# Patient Record
Sex: Male | Born: 1965 | Race: Black or African American | Hispanic: No | Marital: Single | Smoking: Never smoker
Health system: Southern US, Community
[De-identification: ages and names within clinical notes are randomized; demographics above are authoritative.]

## PROBLEM LIST (undated history)

## (undated) ENCOUNTER — Emergency Department: Payer: Medicare HMO

## (undated) DIAGNOSIS — I10 Essential (primary) hypertension: Secondary | ICD-10-CM

## (undated) DIAGNOSIS — J45909 Unspecified asthma, uncomplicated: Secondary | ICD-10-CM

## (undated) DIAGNOSIS — T4145XA Adverse effect of unspecified anesthetic, initial encounter: Secondary | ICD-10-CM

## (undated) DIAGNOSIS — E785 Hyperlipidemia, unspecified: Secondary | ICD-10-CM

---

## 2007-09-02 HISTORY — PX: CARDIAC CATHETERIZATION: SHX172

## 2007-09-02 HISTORY — PX: CORONARY ANGIOPLASTY: SHX604

## 2016-03-03 ENCOUNTER — Ambulatory Visit
Admission: RE | Admit: 2016-03-03 | Discharge: 2016-03-03 | Disposition: A | Discharge status: Home / Self Care | Payer: Disability Insurance | Source: Ambulatory Visit | Attending: Internal Medicine | Admitting: Internal Medicine

## 2016-03-03 ENCOUNTER — Other Ambulatory Visit: Payer: Self-pay | Admitting: Internal Medicine

## 2016-03-03 DIAGNOSIS — S79912A Unspecified injury of left hip, initial encounter: Secondary | ICD-10-CM | POA: Insufficient documentation

## 2016-03-03 DIAGNOSIS — M25551 Pain in right hip: Secondary | ICD-10-CM

## 2016-03-03 DIAGNOSIS — M25552 Pain in left hip: Secondary | ICD-10-CM | POA: Diagnosis present

## 2016-03-03 DIAGNOSIS — S72001A Fracture of unspecified part of neck of right femur, initial encounter for closed fracture: Secondary | ICD-10-CM | POA: Insufficient documentation

## 2016-03-03 DIAGNOSIS — X58XXXA Exposure to other specified factors, initial encounter: Secondary | ICD-10-CM | POA: Insufficient documentation

## 2016-09-08 DIAGNOSIS — J4520 Mild intermittent asthma, uncomplicated: Secondary | ICD-10-CM | POA: Insufficient documentation

## 2016-09-08 DIAGNOSIS — I10 Essential (primary) hypertension: Secondary | ICD-10-CM | POA: Insufficient documentation

## 2016-09-08 DIAGNOSIS — M25551 Pain in right hip: Secondary | ICD-10-CM | POA: Insufficient documentation

## 2017-04-14 DIAGNOSIS — J4540 Moderate persistent asthma, uncomplicated: Secondary | ICD-10-CM | POA: Insufficient documentation

## 2018-05-18 DIAGNOSIS — M1611 Unilateral primary osteoarthritis, right hip: Secondary | ICD-10-CM | POA: Insufficient documentation

## 2018-05-19 DIAGNOSIS — T8859XA Other complications of anesthesia, initial encounter: Secondary | ICD-10-CM

## 2018-05-19 HISTORY — DX: Other complications of anesthesia, initial encounter: T88.59XA

## 2018-05-20 HISTORY — PX: TOTAL HIP ARTHROPLASTY: SHX124

## 2018-09-06 ENCOUNTER — Encounter: Payer: Self-pay | Admitting: Family Medicine

## 2018-09-14 ENCOUNTER — Other Ambulatory Visit: Payer: Self-pay

## 2018-09-14 ENCOUNTER — Telehealth: Payer: Self-pay | Admitting: Gastroenterology

## 2018-09-14 DIAGNOSIS — R195 Other fecal abnormalities: Secondary | ICD-10-CM

## 2018-09-14 NOTE — Telephone Encounter (Signed)
Pt has been scheduled for his colonoscopy on 09/24/18 at [facility] with Dr. Allen Norris.  Thanks [employer]

## 2018-09-14 NOTE — Telephone Encounter (Signed)
Patient called and left message stating he received letter to schedule a colonoscopy.

## 2018-09-16 ENCOUNTER — Other Ambulatory Visit: Payer: Self-pay

## 2018-09-16 ENCOUNTER — Encounter: Payer: Self-pay | Admitting: *Deleted

## 2018-09-22 ENCOUNTER — Telehealth: Payer: Self-pay | Admitting: Gastroenterology

## 2018-09-22 ENCOUNTER — Telehealth: Payer: Self-pay

## 2018-09-22 ENCOUNTER — Other Ambulatory Visit: Payer: Self-pay

## 2018-09-22 MED ORDER — NA SULFATE-K SULFATE-MG SULF 17.5-3.13-1.6 GM/177ML PO SOLN: 1.0000 | Freq: Once | ORAL | 0 refills | Status: AC

## 2018-09-22 NOTE — Telephone Encounter (Signed)
Patient's spouse(Eileen)called stating patient has a colonoscopy scheduled on 09-24-2018 with Dr Allen Norris & may need to r/s due to he has not received prep & instruction. Please call and advise

## 2018-09-22 NOTE — Telephone Encounter (Signed)
Patients spouse Eileens call has been returned.   I requested to email pts instructions and send in rx for bowel prep to pharmacy.  She agreed to this.  She also requested date change from 09/24/18 to 10/01/18.  I've contacted [place] to make change on schedule and updated referral.  Thanks Sharyn Lull

## 2018-09-22 NOTE — Telephone Encounter (Signed)
Instructions will be emailed to "[email or web address]".  Rx has been sent to [facility] in [place] for [employer].  Thanks [employer]

## 2018-09-28 ENCOUNTER — Telehealth: Payer: Self-pay | Admitting: Gastroenterology

## 2018-09-28 NOTE — Telephone Encounter (Signed)
Pt left vm he has a procedure with Dr. Allen Norris 10/01/18 and needs instructions

## 2018-09-28 NOTE — Telephone Encounter (Signed)
Patient instructions were emailed to him on 09/22/18.  He acknowledged he received these instructions and still did not understand when to start the prep.  He has been advised to start his prep at 5pm mix one bottle of suprep with 16oz of cold water repeat again 5 hours prior to the time of his colonoscopy.  The time of procedure would be provided to him by phone call from [place] the day before his procedure.  Thanks [employer]

## 2018-09-30 NOTE — Discharge Instructions (Signed)
General Anesthesia, Adult, Care After  This sheet gives you information about how to care for yourself after your procedure. Your health care provider may also give you more specific instructions. If you have problems or questions, contact your health care provider.  What can I expect after the procedure?  After the procedure, the following side effects are common:  Pain or discomfort at the IV site.  Nausea.  Vomiting.  Sore throat.  Trouble concentrating.  Feeling cold or chills.  Weak or tired.  Sleepiness and fatigue.  Soreness and body aches. These side effects can affect parts of the body that were not involved in surgery.  Follow these instructions at home:    For at least 24 hours after the procedure:  Have a responsible adult stay with you. It is important to have someone help care for you until you are awake and alert.  Rest as needed.  Do not:  Participate in activities in which you could fall or become injured.  Drive.  Use heavy machinery.  Drink alcohol.  Take sleeping pills or medicines that cause drowsiness.  Make important decisions or sign legal documents.  Take care of children on your own.  Eating and drinking  Follow any instructions from your health care provider about eating or drinking restrictions.  When you feel hungry, start by eating small amounts of foods that are soft and easy to digest (bland), such as toast. Gradually return to your regular diet.  Drink enough fluid to keep your urine pale yellow.  If you vomit, rehydrate by drinking water, juice, or clear broth.  General instructions  If you have sleep apnea, surgery and certain medicines can increase your risk for breathing problems. Follow instructions from your health care provider about wearing your sleep device:  Anytime you are sleeping, including during daytime naps.  While taking prescription pain medicines, sleeping medicines, or medicines that make you drowsy.  Return to your normal activities as told by your health care  provider. Ask your health care provider what activities are safe for you.  Take over-the-counter and prescription medicines only as told by your health care provider.  If you smoke, do not smoke without supervision.  Keep all follow-up visits as told by your health care provider. This is important.  Contact a health care provider if:  You have nausea or vomiting that does not get better with medicine.  You cannot eat or drink without vomiting.  You have pain that does not get better with medicine.  You are unable to pass urine.  You develop a skin rash.  You have a fever.  You have redness around your IV site that gets worse.  Get help right away if:  You have difficulty breathing.  You have chest pain.  You have blood in your urine or stool, or you vomit blood.  Summary  After the procedure, it is common to have a sore throat or nausea. It is also common to feel tired.  Have a responsible adult stay with you for the first 24 hours after general anesthesia. It is important to have someone help care for you until you are awake and alert.  When you feel hungry, start by eating small amounts of foods that are soft and easy to digest (bland), such as toast. Gradually return to your regular diet.  Drink enough fluid to keep your urine pale yellow.  Return to your normal activities as told by your health care provider. Ask your health care   provider what activities are safe for you.  This information is not intended to replace advice given to you by your health care provider. Make sure you discuss any questions you have with your health care provider.  Document Released: 11/24/2000 Document Revised: 04/03/2017 Document Reviewed: 04/03/2017  Elsevier Interactive Patient Education  2019 Elsevier Inc[employer]

## 2018-10-01 ENCOUNTER — Ambulatory Visit: Payer: Medicare HMO | Admitting: Anesthesiology

## 2018-10-01 ENCOUNTER — Ambulatory Visit
Admission: RE | Admit: 2018-10-01 | Discharge: 2018-10-01 | Disposition: A | Discharge status: Home / Self Care | Payer: Medicare HMO | Source: Ambulatory Visit | Attending: Gastroenterology | Admitting: Gastroenterology

## 2018-10-01 ENCOUNTER — Ambulatory Visit
Admission: RE | Disposition: A | Discharge status: Home / Self Care | Payer: Self-pay | Source: Ambulatory Visit | Attending: Gastroenterology

## 2018-10-01 DIAGNOSIS — K921 Melena: Secondary | ICD-10-CM | POA: Diagnosis present

## 2018-10-01 DIAGNOSIS — Z9861 Coronary angioplasty status: Secondary | ICD-10-CM | POA: Insufficient documentation

## 2018-10-01 DIAGNOSIS — J45909 Unspecified asthma, uncomplicated: Secondary | ICD-10-CM | POA: Diagnosis not present

## 2018-10-01 DIAGNOSIS — Z7951 Long term (current) use of inhaled steroids: Secondary | ICD-10-CM | POA: Insufficient documentation

## 2018-10-01 DIAGNOSIS — D124 Benign neoplasm of descending colon: Secondary | ICD-10-CM

## 2018-10-01 DIAGNOSIS — Z79899 Other long term (current) drug therapy: Secondary | ICD-10-CM | POA: Insufficient documentation

## 2018-10-01 DIAGNOSIS — E785 Hyperlipidemia, unspecified: Secondary | ICD-10-CM | POA: Diagnosis not present

## 2018-10-01 DIAGNOSIS — I10 Essential (primary) hypertension: Secondary | ICD-10-CM | POA: Diagnosis not present

## 2018-10-01 DIAGNOSIS — K641 Second degree hemorrhoids: Secondary | ICD-10-CM | POA: Insufficient documentation

## 2018-10-01 HISTORY — DX: Unspecified asthma, uncomplicated: J45.909

## 2018-10-01 HISTORY — DX: Essential (primary) hypertension: I10

## 2018-10-01 HISTORY — PX: COLONOSCOPY WITH PROPOFOL: SHX5780

## 2018-10-01 HISTORY — DX: Adverse effect of unspecified anesthetic, initial encounter: T41.45XA

## 2018-10-01 HISTORY — PX: POLYPECTOMY: SHX5525

## 2018-10-01 HISTORY — DX: Hyperlipidemia, unspecified: E78.5

## 2018-10-01 SURGERY — COLONOSCOPY WITH PROPOFOL
Anesthesia: General | Site: Rectum

## 2018-10-01 MED ORDER — LACTATED RINGERS IV SOLN: INTRAVENOUS | Status: DC

## 2018-10-01 MED ORDER — OXYCODONE HCL 5 MG PO TABS: 5.0000 mg | ORAL_TABLET | Freq: Once | ORAL | Status: DC | PRN

## 2018-10-01 MED ORDER — OXYCODONE HCL 5 MG/5ML PO SOLN: 5.0000 mg | Freq: Once | ORAL | Status: DC | PRN

## 2018-10-01 MED ORDER — PROPOFOL 10 MG/ML IV BOLUS
INTRAVENOUS | Status: DC | PRN
  Administered 2018-10-01 (×2): 100 mg via INTRAVENOUS

## 2018-10-01 MED ORDER — STERILE WATER FOR IRRIGATION IR SOLN
Status: DC | PRN
  Administered 2018-10-01: 12:00:00

## 2018-10-01 MED ORDER — LIDOCAINE HCL (CARDIAC) PF 100 MG/5ML IV SOSY
PREFILLED_SYRINGE | INTRAVENOUS | Status: DC | PRN
  Administered 2018-10-01: 50 mg via INTRAVENOUS

## 2018-10-01 SURGICAL SUPPLY — 6 items
CANISTER SUCT 1200ML W/VALVE (MISCELLANEOUS) ×3 IMPLANT
FORCEPS BIOP RAD 4 LRG CAP 4 (CUTTING FORCEPS) ×3 IMPLANT
GOWN CVR UNV OPN BCK APRN NK (MISCELLANEOUS) ×2 IMPLANT
GOWN ISOL THUMB LOOP REG UNIV (MISCELLANEOUS) ×4
KIT ENDO PROCEDURE OLY (KITS) ×3 IMPLANT
WATER STERILE IRR 250ML POUR (IV SOLUTION) ×3 IMPLANT

## 2018-10-01 NOTE — Transfer of Care (Signed)
Immediate Anesthesia Transfer of Care Note  Patient: Melvin Sanchez  Procedure(s) Performed: COLONOSCOPY WITH BIOPSIES (N/A Rectum) POLYPECTOMY (N/A Rectum)  Patient Location: PACU  Anesthesia Type: General  Level of Consciousness: awake, alert  and patient cooperative  Airway and Oxygen Therapy: Patient Spontanous Breathing and Patient connected to supplemental oxygen  Post-op Assessment: Post-op Vital signs reviewed, Patient's Cardiovascular Status Stable, Respiratory Function Stable, Patent Airway and No signs of Nausea or vomiting  Post-op Vital Signs: Reviewed and stable  Complications: No apparent anesthesia complications

## 2018-10-01 NOTE — H&P (Signed)
Lucilla Lame, MD [address] [place],  [address] [phone] Fax : [phone]  Primary Care Physician:  Letta Median, MD Primary Gastroenterologist:  Dr. Allen Norris  Pre-Procedure History & Physical: HPI:  Stephen Baruch is a 53 y.o. male is here for an colonoscopy.   Past Medical History:  Diagnosis Date  . Asthma   . Complication of anesthesia 05/19/2018   See Anesth note  [facility].   Marland Kitchen Hyperlipidemia   . Hypertension     Past Surgical History:  Procedure Laterality Date  . CARDIAC CATHETERIZATION  2009  . CORONARY ANGIOPLASTY  2009   hospital in the [place]  . TOTAL HIP ARTHROPLASTY Right 05/20/2018   [facility]    Prior to Admission medications   Medication Sig Start Date End Date Taking? Authorizing Provider  albuterol (PROVENTIL HFA;VENTOLIN HFA) 108 (90 Base) MCG/ACT inhaler Inhale into the lungs every 6 (six) hours as needed for wheezing or shortness of breath.   Yes [provider]  atorvastatin (LIPITOR) 20 MG tablet Take 20 mg by mouth at bedtime.   Yes [provider]  cetirizine (ZYRTEC) 10 MG tablet Take 10 mg by mouth daily.   Yes [provider]  hydrochlorothiazide (HYDRODIURIL) 25 MG tablet Take 25 mg by mouth at bedtime.   Yes [provider]  mometasone-formoterol (DULERA) 100-5 MCG/ACT AERO Inhale 2 puffs into the lungs 2 (two) times daily.   Yes [provider]  tiotropium (SPIRIVA) 18 MCG inhalation capsule Place 18 mcg into inhaler and inhale 2 (two) times daily.   Yes [provider]    Allergies as of 09/14/2018  . (Not on File)    History reviewed. No pertinent family history.  Social History   Socioeconomic History  . Marital status: Single    Spouse name: Not on file  . Number of children: Not on file  . Years of education: Not on file  . Highest education level: Not on file  Occupational History  . Not on file  Social Needs  . Financial  resource strain: Not on file  . Food insecurity:    Worry: Not on file    Inability: Not on file  . Transportation needs:    Medical: Not on file    Non-medical: Not on file  Tobacco Use  . Smoking status: Never Smoker  . Smokeless tobacco: Never Used  Substance and Sexual Activity  . Alcohol use: Not Currently    Frequency: Never  . Drug use: Not on file  . Sexual activity: Not on file  Lifestyle  . Physical activity:    Days per week: Not on file    Minutes per session: Not on file  . Stress: Not on file  Relationships  . Social connections:    Talks on phone: Not on file    Gets together: Not on file    Attends religious service: Not on file    Active member of club or organization: Not on file    Attends meetings of clubs or organizations: Not on file    Relationship status: Not on file  . Intimate partner violence:    Fear of current or ex partner: Not on file    Emotionally abused: Not on file    Physically abused: Not on file    Forced sexual activity: Not on file  Other Topics Concern  . Not on file  Social History Narrative  . Not on file    Review  of Systems: See HPI, otherwise negative ROS  Physical Exam: BP 135/88   Pulse 94   Temp 98.2 F (36.8 C)   Ht 6\' 5"  (1.956 m)   Wt (!) 139.3 kg   SpO2 100%   BMI 36.40 kg/m  General:   Alert,  pleasant and cooperative in NAD Head:  Normocephalic and atraumatic. Neck:  Supple; no masses or thyromegaly. Lungs:  Clear throughout to auscultation.    Heart:  Regular rate and rhythm. Abdomen:  Soft, nontender and nondistended. Normal bowel sounds, without guarding, and without rebound.   Neurologic:  Alert and  oriented x4;  grossly normal neurologically.  Impression/Plan: Cobin Cadavid is here for an colonoscopy to be performed for hematochezia  Risks, benefits, limitations, and alternatives regarding  colonoscopy have been reviewed with the patient.  Questions have been answered.  All parties  agreeable.   Lucilla Lame, MD  10/01/2018, 11:31 AM

## 2018-10-01 NOTE — Anesthesia Postprocedure Evaluation (Signed)
Anesthesia Post Note  Patient: Melvin Sanchez  Procedure(s) Performed: COLONOSCOPY WITH BIOPSIES (N/A Rectum) POLYPECTOMY (N/A Rectum)  Patient location during evaluation: PACU Anesthesia Type: General Level of consciousness: awake and alert Pain management: pain level controlled Vital Signs Assessment: post-procedure vital signs reviewed and stable Respiratory status: spontaneous breathing Cardiovascular status: stable Anesthetic complications: no    Jaci Standard, III,  Mollee Neer D

## 2018-10-01 NOTE — Op Note (Signed)
[facility]astroenter[facility] Patient Name: Melvin Sanchez Procedure Date: 10/01/2018 12:06 PM MRN: [id number] Account #: [id number] Date of Birth: Jan 20, 1966 Admit Type: Outpatient Age: 53 Room: [facility] OR ROOM 01 Gender: Male Note Status: Finalized Procedure:            Colonoscopy Indications:          Hematochezia Providers:            Lucilla Lame MD, MD Referring MD:         Mikeal Hawthorne. Brynda Greathouse MD, MD (Referring MD) Medicines:            Propofol per Anesthesia Complications:        No immediate complications. Procedure:            Pre-Anesthesia Assessment:                       - Prior to the procedure, a History and Physical was                        performed, and patient medications and allergies were                        reviewed. The patient's tolerance of previous                        anesthesia was also reviewed. The risks and benefits of                        the procedure and the sedation options and risks were                        discussed with the patient. All questions were                        answered, and informed consent was obtained. Prior                        Anticoagulants: The patient has taken no previous                        anticoagulant or antiplatelet agents. ASA Grade                        Assessment: II - A patient with mild systemic disease.                        After reviewing the risks and benefits, the patient was                        deemed in satisfactory condition to undergo the                        procedure.                       After obtaining informed consent, the colonoscope was                        passed under direct vision. Throughout the procedure,  the patient's blood pressure, pulse, and oxygen                        saturations were monitored continuously. The was                        introduced through the anus and advanced to the the                        cecum,  identified by appendiceal orifice and ileocecal                        valve. The colonoscopy was performed without                        difficulty. The patient tolerated the procedure well.                        The quality of the bowel preparation was excellent. Findings:      The perianal and digital rectal examinations were normal. Pertinent       negatives include normal sphincter tone.      A 2 mm polyp was found in the descending colon. The polyp was sessile.       The polyp was removed with a cold biopsy forceps. Resection and       retrieval were complete.      Non-bleeding internal hemorrhoids were found during retroflexion. The       hemorrhoids were Grade II (internal hemorrhoids that prolapse but reduce       spontaneously). Impression:           - One 2 mm polyp in the descending colon, removed with                        a cold biopsy forceps. Resected and retrieved.                       - Non-bleeding internal hemorrhoids. Recommendation:       - Discharge patient to home.                       - Resume previous diet.                       - Continue present medications.                       - Await pathology results.                       - Repeat colonoscopy in 5 years if polyp adenoma and 10                        years if hyperplastic Procedure Code(s):    --- Professional ---                       [phone], Colonoscopy, flexible; with biopsy, single or                        multiple Diagnosis Code(s):    --- Professional ---  K92.1, Melena (includes Hematochezia)                       D12.4, Benign neoplasm of descending colon CPT copyright 2018 American Medical Association. All rights reserved. The codes documented in this report are preliminary and upon coder review may  be revised to meet current compliance requirements. Lucilla Lame MD, MD 10/01/2018 12:28:35 PM This report has been signed electronically. Number of Addenda: 0 Note  Initiated On: 10/01/2018 12:06 PM Scope Withdrawal Time: 0 hours 7 minutes 2 seconds  Total Procedure Duration: 0 hours 10 minutes 51 seconds       [facility]

## 2018-10-01 NOTE — Anesthesia Preprocedure Evaluation (Signed)
Anesthesia Evaluation  Patient identified by MRN, date of birth, ID band Patient awake    Reviewed: Allergy & Precautions, H&P , NPO status , Patient's Chart, lab work & pertinent test results  Airway Mallampati: II  TM Distance: >3 FB Neck ROM: full    Dental no notable dental hx.    Pulmonary asthma ,    Pulmonary exam normal breath sounds clear to auscultation       Cardiovascular hypertension, On Medications Normal cardiovascular exam Rhythm:regular Rate:Normal     Neuro/Psych negative neurological ROS     GI/Hepatic negative GI ROS, (+)     substance abuse  ,   Endo/Other  negative endocrine ROS  Renal/GU negative Renal ROS  negative genitourinary   Musculoskeletal   Abdominal   Peds  Hematology negative hematology ROS (+)   Anesthesia Other Findings   Reproductive/Obstetrics                             Anesthesia Physical Anesthesia Plan  ASA: II  Anesthesia Plan: General   Post-op Pain Management:    Induction:   PONV Risk Score and Plan:   Airway Management Planned:   Additional Equipment:   Intra-op Plan:   Post-operative Plan:   Informed Consent: I have reviewed the patients History and Physical, chart, labs and discussed the procedure including the risks, benefits and alternatives for the proposed anesthesia with the patient or authorized representative who has indicated his/her understanding and acceptance.       Plan Discussed with:   Anesthesia Plan Comments:         Anesthesia Quick Evaluation

## 2018-10-01 NOTE — Anesthesia Procedure Notes (Signed)
Procedure Name: General with mask airway Performed by: Puja Caffey M, CRNA Pre-anesthesia Checklist: Patient identified, Emergency Drugs available, Suction available, Patient being monitored and Timeout performed Patient Re-evaluated:Patient Re-evaluated prior to induction Oxygen Delivery Method: Nasal cannula       

## 2018-10-04 ENCOUNTER — Encounter: Payer: Self-pay | Admitting: Gastroenterology

## 2020-07-06 ENCOUNTER — Other Ambulatory Visit: Payer: Self-pay | Admitting: Acute Care

## 2020-07-06 ENCOUNTER — Other Ambulatory Visit (HOSPITAL_COMMUNITY): Payer: Self-pay | Admitting: Acute Care

## 2020-07-06 DIAGNOSIS — M79602 Pain in left arm: Secondary | ICD-10-CM | POA: Insufficient documentation

## 2020-07-06 DIAGNOSIS — M542 Cervicalgia: Secondary | ICD-10-CM | POA: Insufficient documentation

## 2020-07-17 ENCOUNTER — Other Ambulatory Visit: Payer: Self-pay

## 2020-07-17 ENCOUNTER — Ambulatory Visit
Admission: RE | Admit: 2020-07-17 | Discharge: 2020-07-17 | Disposition: A | Discharge status: Home / Self Care | Payer: Medicare HMO | Source: Ambulatory Visit | Attending: Acute Care | Admitting: Acute Care

## 2020-07-17 DIAGNOSIS — M542 Cervicalgia: Secondary | ICD-10-CM | POA: Diagnosis not present

## 2020-07-20 DIAGNOSIS — R200 Anesthesia of skin: Secondary | ICD-10-CM | POA: Insufficient documentation

## 2020-07-25 DIAGNOSIS — M4802 Spinal stenosis, cervical region: Secondary | ICD-10-CM | POA: Insufficient documentation

## 2020-07-25 DIAGNOSIS — M5412 Radiculopathy, cervical region: Secondary | ICD-10-CM | POA: Insufficient documentation

## 2020-12-03 HISTORY — PX: TOTAL HIP ARTHROPLASTY: SHX124

## 2021-05-07 ENCOUNTER — Telehealth: Payer: Medicare HMO

## 2021-05-07 NOTE — Telephone Encounter (Signed)
Copied from [facility] [phone]. Topic: Appointment Scheduling - Scheduling Inquiry for Clinic >> May 07, 2021  9:06 AM Alanda Slim E wrote: Reason for CRM: Tawanna Sat from [facility]) Bebe Liter called to see if this office excepts pts that are apart of the [place] [facility] please send her an email with an answer to this question / Email at [email or web address]   Routing to office manager to advise.

## 2021-09-23 ENCOUNTER — Telehealth: Payer: Self-pay

## 2021-09-23 NOTE — Telephone Encounter (Signed)
Left message on patient's voicemail to  reschedule new patient appointment on 09-24-21.

## 2021-09-24 ENCOUNTER — Ambulatory Visit: Payer: Medicare HMO | Admitting: Internal Medicine

## 2021-09-26 ENCOUNTER — Telehealth: Payer: Self-pay

## 2021-09-26 ENCOUNTER — Encounter: Payer: Self-pay | Admitting: Internal Medicine

## 2021-09-26 ENCOUNTER — Other Ambulatory Visit: Payer: Self-pay

## 2021-09-26 ENCOUNTER — Ambulatory Visit (INDEPENDENT_AMBULATORY_CARE_PROVIDER_SITE_OTHER): Payer: Medicare HMO | Admitting: Internal Medicine

## 2021-09-26 VITALS — BP 146/92 | HR 88 | Temp 98.0°F | Resp 16 | Ht 77.0 in | Wt 309.8 lb

## 2021-09-26 DIAGNOSIS — R0602 Shortness of breath: Secondary | ICD-10-CM | POA: Diagnosis not present

## 2021-09-26 NOTE — Telephone Encounter (Signed)
Chest CT ordered. Printed. Put on Titania's desk-Toni

## 2021-09-26 NOTE — Telephone Encounter (Signed)
Faxed medical record release to [facility] [phone]

## 2021-09-26 NOTE — Patient Instructions (Signed)
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
## 2021-10-07 ENCOUNTER — Telehealth: Payer: Self-pay

## 2021-10-07 NOTE — Telephone Encounter (Signed)
Patient has been advised that he is scheduled for ct chest on 10/17/21 @ 1:30 [email or web address]

## 2021-10-08 ENCOUNTER — Ambulatory Visit: Payer: Medicare HMO | Admitting: Internal Medicine

## 2021-10-09 ENCOUNTER — Ambulatory Visit (INDEPENDENT_AMBULATORY_CARE_PROVIDER_SITE_OTHER): Payer: Medicare HMO | Admitting: Internal Medicine

## 2021-10-09 ENCOUNTER — Other Ambulatory Visit: Payer: Self-pay

## 2021-10-09 ENCOUNTER — Telehealth: Payer: Self-pay

## 2021-10-09 DIAGNOSIS — R0602 Shortness of breath: Secondary | ICD-10-CM | POA: Diagnosis not present

## 2021-10-09 NOTE — Telephone Encounter (Signed)
Office note from 09-26-21 was faxed to [facility] as requested at [phone].

## 2021-10-17 ENCOUNTER — Ambulatory Visit
Admission: RE | Admit: 2021-10-17 | Discharge: 2021-10-17 | Disposition: A | Discharge status: Home / Self Care | Payer: Medicare HMO | Source: Ambulatory Visit | Attending: Internal Medicine | Admitting: Internal Medicine

## 2021-10-17 DIAGNOSIS — R0602 Shortness of breath: Secondary | ICD-10-CM | POA: Insufficient documentation

## 2021-10-22 ENCOUNTER — Encounter: Payer: Self-pay | Admitting: Internal Medicine

## 2021-10-22 ENCOUNTER — Ambulatory Visit (INDEPENDENT_AMBULATORY_CARE_PROVIDER_SITE_OTHER): Payer: Medicare HMO | Admitting: Internal Medicine

## 2021-10-22 ENCOUNTER — Other Ambulatory Visit: Payer: Self-pay

## 2021-10-22 ENCOUNTER — Telehealth: Payer: Self-pay

## 2021-10-22 DIAGNOSIS — R0602 Shortness of breath: Secondary | ICD-10-CM

## 2021-10-22 DIAGNOSIS — J4520 Mild intermittent asthma, uncomplicated: Secondary | ICD-10-CM

## 2021-10-22 DIAGNOSIS — M4802 Spinal stenosis, cervical region: Secondary | ICD-10-CM | POA: Diagnosis not present

## 2021-10-22 MED ORDER — TRELEGY ELLIPTA 100-62.5-25 MCG/ACT IN AEPB
1.0000 | INHALATION_SPRAY | Freq: Every day | RESPIRATORY_TRACT | 11 refills | Status: AC
Start: 2021-10-22 — End: ?

## 2021-10-22 NOTE — Patient Instructions (Signed)
Asthma, Adult ?Asthma is a long-term (chronic) condition in which the airways get tight and narrow. The airways are the breathing passages that lead from the nose and mouth down into the lungs. A person with asthma will have times when symptoms get worse. These are called asthma attacks. They can cause coughing, whistling sounds when you breathe (wheezing), shortness of breath, and chest pain. They can make it hard to breathe. There is no cure for asthma, but medicines and lifestyle changes can help control it. ?There are many things that can bring on an asthma attack or make asthma symptoms worse (triggers). Common triggers include: ?Mold. ?Dust. ?Cigarette smoke. ?Cockroaches. ?Things that can cause allergy symptoms (allergens). These include animal skin flakes (dander) and pollen from trees or grass. ?Things that pollute the air. These may include household cleaners, wood smoke, smog, or chemical odors. ?Cold air, weather changes, and wind. ?Crying or laughing hard. ?Stress. ?Certain medicines or drugs. ?Certain foods such as dried fruit, potato chips, and grape juice. ?Infections, such as a cold or the flu. ?Certain medical conditions or diseases. ?Exercise or tiring activities. ?Asthma may be treated with medicines and by staying away from the things that cause asthma attacks. Types of medicines may include: ?Controller medicines. These help prevent asthma symptoms. They are usually taken every day. ?Fast-acting reliever or rescue medicines. These quickly relieve asthma symptoms. They are used as needed and provide short-term relief. ?Allergy medicines if your attacks are brought on by allergens. ?Medicines to help control the body's defense (immune) system. ?Follow these instructions at home: ?Avoiding triggers in your home ?Change your heating and air conditioning filter often. ?Limit your use of fireplaces and wood stoves. ?Get rid of pests (such as roaches and mice) and their droppings. ?Throw away plants  if you see mold on them. ?Clean your floors. Dust regularly. Use cleaning products that do not smell. ?Have someone vacuum when you are not home. Use a vacuum cleaner with a HEPA filter if possible. ?Replace carpet with wood, tile, or vinyl flooring. Carpet can trap animal skin flakes and dust. ?Use allergy-proof pillows, mattress covers, and box spring covers. ?Wash bed sheets and blankets every week in hot water. Dry them in a dryer. ?Keep your bedroom free of any triggers. ?Avoid pets and keep windows closed when things that cause allergy symptoms are in the air. ?Use blankets that are made of polyester or cotton. ?Clean bathrooms and kitchens with bleach. If possible, have someone repaint the walls in these rooms with mold-resistant paint. Keep out of the rooms that are being cleaned and painted. ?Wash your hands often with soap and water. If soap and water are not available, use hand sanitizer. ?Do not allow anyone to smoke in your home. ?General instructions ?Take over-the-counter and prescription medicines only as told by your doctor. ?Talk with your doctor if you have questions about how or when to take your medicines. ?Make note if you need to use your medicines more often than usual. ?Do not use any products that contain nicotine or tobacco, such as cigarettes and e-cigarettes. If you need help quitting, ask your doctor. ?Stay away from secondhand smoke. ?Avoid doing things outdoors when allergen counts are high and when air quality is low. ?Wear a ski mask when doing outdoor activities in the winter. The mask should cover your nose and mouth. Exercise indoors on cold days if you can. ?Warm up before you exercise. Take time to cool down after exercise. ?Use a peak flow meter as   told by your doctor. A peak flow meter is a tool that measures how well the lungs are working. ?Keep track of the peak flow meter's readings. Write them down. ?Follow your asthma action plan. This is a written plan for taking care  of your asthma and treating your attacks. ?Make sure you get all the shots (vaccines) that your doctor recommends. Ask your doctor about a flu shot and a pneumonia shot. ?Keep all follow-up visits as told by your doctor. This is important. ?Contact a doctor if: ?You have wheezing, shortness of breath, or a cough even while taking medicine to prevent attacks. ?The mucus you cough up (sputum) is thicker than usual. ?The mucus you cough up changes from clear or white to yellow, green, gray, or bloody. ?You have problems from the medicine you are taking, such as: ?A rash. ?Itching. ?Swelling. ?Trouble breathing. ?You need reliever medicines more than 2-3 times a week. ?Your peak flow reading is still at 50-79% of your personal best after following the action plan for 1 hour. ?You have a fever. ?Get help right away if: ?You seem to be worse and are not responding to medicine during an asthma attack. ?You are short of breath even at rest. ?You get short of breath when doing very little activity. ?You have trouble eating, drinking, or talking. ?You have chest pain or tightness. ?You have a fast heartbeat. ?Your lips or fingernails start to turn blue. ?You are light-headed or dizzy, or you faint. ?Your peak flow is less than 50% of your personal best. ?You feel too tired to breathe normally. ?Summary ?Asthma is a long-term (chronic) condition in which the airways get tight and narrow. An asthma attack can make it hard to breathe. ?Asthma cannot be cured, but medicines and lifestyle changes can help control it. ?Make sure you understand how to avoid triggers and how and when to use your medicines. ?This information is not intended to replace advice given to you by your health care provider. Make sure you discuss any questions you have with your health care provider. ?Document Revised: 12/11/2019 Document Reviewed: 12/21/2019 ?Elsevier Patient Education ? 2022 [employer] ? ?

## 2021-10-22 NOTE — Progress Notes (Signed)
[facility] [place], [place] [address]  Pulmonary Sleep Medicine   Office Visit Note  Patient Name: Melvin Sanchez DOB: 1966/01/02 MRN [id number]  Date of Service: 10/22/2021  Complaints/HPI: CT chest was done and shows no evidence of fibrosis. He did have some infalmmatory bronchitis. He does take albuterol and spiriva. Not smoking. Not around smokers. Patient states he still has a cough noted which is sometimes with sputum. He had PFT done and report is noted to show mild reduction in lung function.  Patient also has history of cervical spinal stenosis which is contributing significantly to his symptoms pain issues this needs to be evaluated  ROS  General: (-) fever, (-) chills, (-) night sweats, (-) weakness Skin: (-) rashes, (-) itching,. Eyes: (-) visual changes, (-) redness, (-) itching. Nose and Sinuses: (-) nasal stuffiness or itchiness, (-) postnasal drip, (-) nosebleeds, (-) sinus trouble. Mouth and Throat: (-) sore throat, (-) hoarseness. Neck: (-) swollen glands, (-) enlarged thyroid, (-) neck pain. Respiratory: + cough, (-) bloody sputum, + shortness of breath, + wheezing. Cardiovascular: - ankle swelling, (-) chest pain. Lymphatic: (-) lymph node enlargement. Neurologic: (-) numbness, (-) tingling. Psychiatric: (-) anxiety, (-) depression   Current Medication: Outpatient Encounter Medications as of 10/22/2021  Medication Sig   albuterol (PROVENTIL HFA;VENTOLIN HFA) 108 (90 Base) MCG/ACT inhaler Inhale into the lungs every 6 (six) hours as needed for wheezing or shortness of breath.   atorvastatin (LIPITOR) 20 MG tablet Take 20 mg by mouth at bedtime.   cetirizine (ZYRTEC) 10 MG tablet Take 10 mg by mouth daily.   hydrochlorothiazide (HYDRODIURIL) 25 MG tablet Take 25 mg by mouth at bedtime.   mometasone-formoterol (DULERA) 100-5 MCG/ACT AERO Inhale 2 puffs into the lungs 2 (two) times daily.   tiotropium (SPIRIVA) 18 MCG inhalation capsule  Place 18 mcg into inhaler and inhale 2 (two) times daily.   No facility-administered encounter medications on file as of 10/22/2021.    Surgical History: Past Surgical History:  Procedure Laterality Date   CARDIAC CATHETERIZATION  2009   COLONOSCOPY WITH PROPOFOL N/A 10/01/2018   Procedure: COLONOSCOPY WITH BIOPSIES;  Surgeon: Lucilla Lame, MD;  Location: [facility];  Service: Endoscopy;  Laterality: N/A;   CORONARY ANGIOPLASTY  2009   hospital in the [facility]   POLYPECTOMY N/A 10/01/2018   Procedure: POLYPECTOMY;  Surgeon: Lucilla Lame, MD;  Location: [facility];  Service: Endoscopy;  Laterality: N/A;   TOTAL HIP ARTHROPLASTY Right 05/20/2018   [facility]   TOTAL HIP ARTHROPLASTY Left 12/03/2020    Medical History: Past Medical History:  Diagnosis Date   Asthma    Complication of anesthesia 05/19/2018   See Anesth note  [facility].    Hyperlipidemia    Hypertension     Family History: Family History  Problem Relation Age of Onset   Heart disease Mother    Hypertension Mother    Alzheimer's disease Mother    Heart disease Father    Early death Father    Hypertension Father    Heart disease Sister    Hypertension Sister    Heart disease Sister    Hypertension Sister    Hypertension Maternal Aunt    Hypertension Maternal Uncle    Hypertension Paternal Aunt    Hypertension Paternal Uncle    Heart disease Maternal Grandmother    Hypertension Maternal Grandmother    Heart disease Maternal Grandfather    Hypertension Maternal Grandfather    Hypertension Paternal Grandmother  Alzheimer's disease Paternal Grandfather    Hypertension Paternal Grandfather     Social History: Social History   Socioeconomic History   Marital status: Single    Spouse name: Not on file   Number of children: Not on file   Years of education: Not on file   Highest education level: Not on file  Occupational History   Not on file  Tobacco Use   Smoking status: Never    Smokeless tobacco: Never  Vaping Use   Vaping Use: Never used  Substance and Sexual Activity   Alcohol use: Not Currently   Drug use: Not Currently   Sexual activity: Not on file  Other Topics Concern   Not on file  Social History Narrative   Not on file   Social Determinants of Health   Financial Resource Strain: Not on file  Food Insecurity: Not on file  Transportation Needs: Not on file  Physical Activity: Not on file  Stress: Not on file  Social Connections: Not on file  Intimate Partner Violence: Not on file    Vital Signs: Blood pressure 138/86, pulse 90, temperature 98.4 F (36.9 C), resp. rate 16, height 6\' 5"  (1.956 m), weight (!) 316 lb 3.2 oz (143.4 kg), SpO2 97 %.  Examination: General Appearance: The patient is well-developed, well-nourished, and in no distress. Skin: Gross inspection of skin unremarkable. Head: normocephalic, no gross deformities. Eyes: no gross deformities noted. ENT: ears appear grossly normal no exudates. Neck: Supple. No thyromegaly. No LAD. Respiratory: no rhonchi noted. Cardiovascular: Normal S1 and S2 without murmur or rub. Extremities: No cyanosis. pulses are equal. Neurologic: Alert and oriented. No involuntary movements.  LABS: No results found for this or any previous visit (from the past 2160 hour(s)).  Radiology: CT Chest High Resolution  Result Date: 10/18/2021 CLINICAL DATA:  Dyspnea on exertion, shortness of breath, cough, sputum production EXAM: CT CHEST WITHOUT CONTRAST TECHNIQUE: Multidetector CT imaging of the chest was performed following the standard protocol without intravenous contrast. High resolution imaging of the lungs, as well as inspiratory and expiratory imaging, was performed. RADIATION DOSE REDUCTION: This exam was performed according to the departmental dose-optimization program which includes automated exposure control, adjustment of the mA and/or kV according to patient size and/or use of iterative  reconstruction technique. COMPARISON:  None. FINDINGS: Cardiovascular: No significant vascular findings. Normal heart size. No pericardial effusion. Mediastinum/Nodes: No enlarged mediastinal, hilar, or axillary lymph nodes. Thyroid gland, trachea, and esophagus demonstrate no significant findings. Lungs/Pleura: Mild, diffuse bilateral bronchial wall thickening. Elevation of the right hemidiaphragm with associated scarring or atelectasis. No evidence of fibrotic interstitial lung disease. No significant air trapping on expiratory phase imaging. No pleural effusion or pneumothorax. Upper Abdomen: No acute abnormality.  Hepatic steatosis. Musculoskeletal: No chest wall abnormality. No suspicious osseous lesions identified. IMPRESSION: 1. No evidence of fibrotic interstitial lung disease. 2. Mild, diffuse bilateral bronchial wall thickening, consistent with nonspecific infectious or inflammatory bronchitis. 3. Elevation of the right hemidiaphragm with associated scarring or atelectasis. 4. Hepatic steatosis. Electronically Signed   By: Delanna Ahmadi M.D.   On: 10/18/2021 15:56    No results found.  CT Chest High Resolution  Result Date: 10/18/2021 CLINICAL DATA:  Dyspnea on exertion, shortness of breath, cough, sputum production EXAM: CT CHEST WITHOUT CONTRAST TECHNIQUE: Multidetector CT imaging of the chest was performed following the standard protocol without intravenous contrast. High resolution imaging of the lungs, as well as inspiratory and expiratory imaging, was performed. RADIATION DOSE REDUCTION: This exam  was performed according to the departmental dose-optimization program which includes automated exposure control, adjustment of the mA and/or kV according to patient size and/or use of iterative reconstruction technique. COMPARISON:  None. FINDINGS: Cardiovascular: No significant vascular findings. Normal heart size. No pericardial effusion. Mediastinum/Nodes: No enlarged mediastinal, hilar, or  axillary lymph nodes. Thyroid gland, trachea, and esophagus demonstrate no significant findings. Lungs/Pleura: Mild, diffuse bilateral bronchial wall thickening. Elevation of the right hemidiaphragm with associated scarring or atelectasis. No evidence of fibrotic interstitial lung disease. No significant air trapping on expiratory phase imaging. No pleural effusion or pneumothorax. Upper Abdomen: No acute abnormality.  Hepatic steatosis. Musculoskeletal: No chest wall abnormality. No suspicious osseous lesions identified. IMPRESSION: 1. No evidence of fibrotic interstitial lung disease. 2. Mild, diffuse bilateral bronchial wall thickening, consistent with nonspecific infectious or inflammatory bronchitis. 3. Elevation of the right hemidiaphragm with associated scarring or atelectasis. 4. Hepatic steatosis. Electronically Signed   By: Delanna Ahmadi M.D.   On: 10/18/2021 15:56      Assessment and Plan: Patient Active Problem List   Diagnosis Date Noted   Cervical radiculopathy 07/25/2020   Foraminal stenosis of cervical region 07/25/2020   Hand numbness 07/20/2020   Arm pain, left 07/06/2020   Neck pain 07/06/2020   Hematochezia    Benign neoplasm of descending colon    Primary osteoarthritis of right hip 05/18/2018   Moderate persistent asthma 04/14/2017   Pain of right hip joint 09/08/2016   Essential (primary) hypertension 09/08/2016   Mild intermittent asthma, uncomplicated 31/54/0086   1. Obesity, morbid ([place]) Obesity Counseling: Had a lengthy discussion regarding patients BMI and weight issues. Patient was instructed on portion control as well as increased activity. Also discussed caloric restrictions with trying to maintain intake less than 2000 Kcal. Discussions were made in accordance with the 5As of weight management. Simple actions such as not eating late and if able to, taking a walk is suggested.   2. SOB (shortness of breath) Probably related to underlying morbid obesity  contributing and there was not really any evidence of pulmonary fibrotic lung disease  3. Chronic asthma, mild intermittent, uncomplicated Medications were renewed today - Fluticasone-Umeclidin-Vilant (TRELEGY ELLIPTA) 100-62.5-25 MCG/ACT AEPB; Inhale 1 puff into the lungs daily.  Dispense: 1 each; Refill: 11  4. Cervical stenosis of spinal canal Of concern I suggested that he get a neurosurgical opinion when I did the referral for him today - Ambulatory referral to Neurosurgery   General Counseling: I have discussed the findings of the evaluation and examination with Cletus Gash.  I have also discussed any further diagnostic evaluation thatmay be needed or ordered today. Kaine verbalizes understanding of the findings of todays visit. We also reviewed his medications today and discussed drug interactions and side effects including but not limited excessive drowsiness and altered mental states. We also discussed that there is always a risk not just to him but also people around him. he has been encouraged to call the office with any questions or concerns that should arise related to todays visit.  Orders Placed This Encounter  Procedures   Ambulatory referral to Neurosurgery    Referral Priority:   Routine    Referral Type:   Surgical    Referral Reason:   Specialty Services Required    Requested Specialty:   Neurosurgery    Number of Visits Requested:   1     Time spent: 64  I have personally obtained a history, examined the patient, evaluated laboratory and imaging results, formulated  the assessment and plan and placed orders.    Allyne Gee, MD [facility] Pulmonary and Critical Care Sleep medicine

## 2021-10-22 NOTE — Telephone Encounter (Signed)
Neurosurgery referral sent via proficient to [facility]

## 2021-10-24 NOTE — Telephone Encounter (Signed)
Neuro appointment> 11/28/21 @ 1:00-Melvin Sanchez

## 2021-11-05 NOTE — Procedures (Signed)
[facility] [address] [place], [address]    Complete Pulmonary Function Testing Interpretation:  FINDINGS:  Forced vital capacity is mildly decreased.  FEV1 is 2.97 L which is 77% of predicted and is mildly decreased.  F1 FVC ratio is normal.  Total lung capacity is moderately decreased.  Residual volume is decreased residual volume total lung capacity ratio is increased FRC is decreased.  DLCO was mildly decreased.  Postbronchodilator no significant change in FEV1  IMPRESSION:  This pulmonary function study is consistent with moderate restrictive lung disease clinical correlation is recommended  Allyne Gee, MD [facility] Pulmonary Critical Care Medicine Sleep Medicine

## 2021-11-06 ENCOUNTER — Encounter: Payer: Self-pay | Admitting: Internal Medicine

## 2021-11-06 LAB — PULMONARY FUNCTION TEST

## 2022-04-22 ENCOUNTER — Ambulatory Visit: Payer: Medicare HMO | Admitting: Internal Medicine

## 2023-08-12 ENCOUNTER — Telehealth: Payer: Self-pay | Admitting: Internal Medicine

## 2023-08-12 NOTE — Telephone Encounter (Signed)
09/01/21-present MR mailed to [facility]; Attn: [facility], [address], [place], [place] [address]

## 2023-08-13 IMAGING — CT CT CHEST HIGH RESOLUTION
2 of 7 series · 15 of 36 positions shown, 18 images · non-contrast
Comparison: None.

CLINICAL DATA: Dyspnea on exertion, shortness of breath, cough,
sputum production



[Series 5: high resolution retro · axial · 0.75mm/px · z∈[-785,-532]mm · 12 of 299 slices shown, 15 images]
[im 23/299  mediastinal]
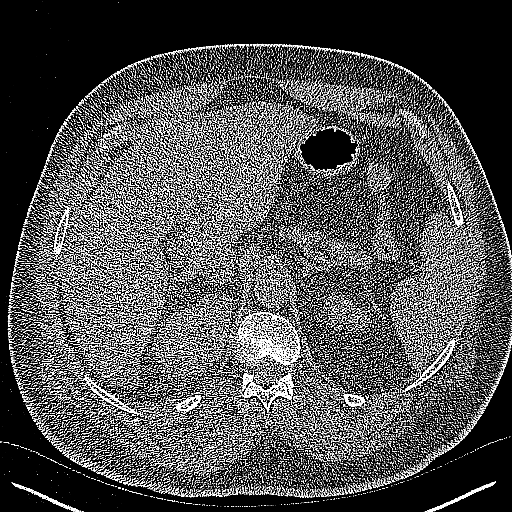
[im 23/299  lung]
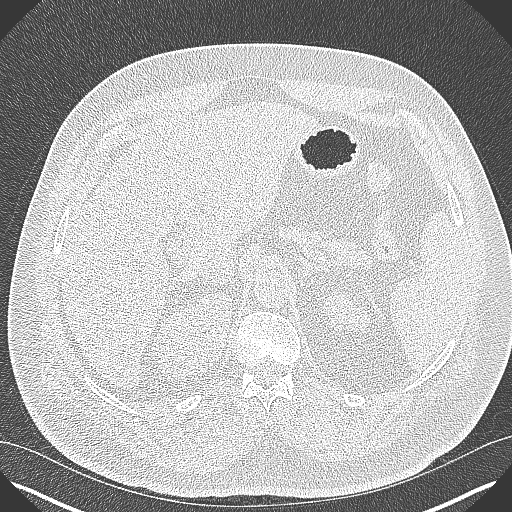
[im 46/299  lung]
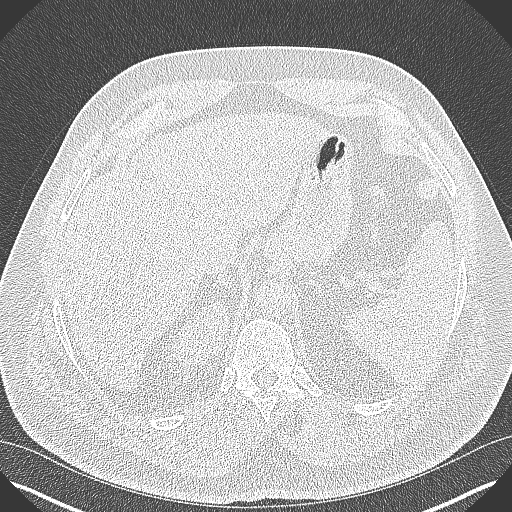
[im 69/299  lung]
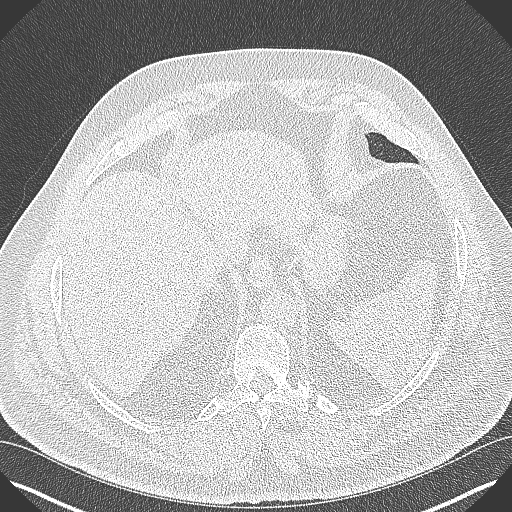
[im 92/299  lung]
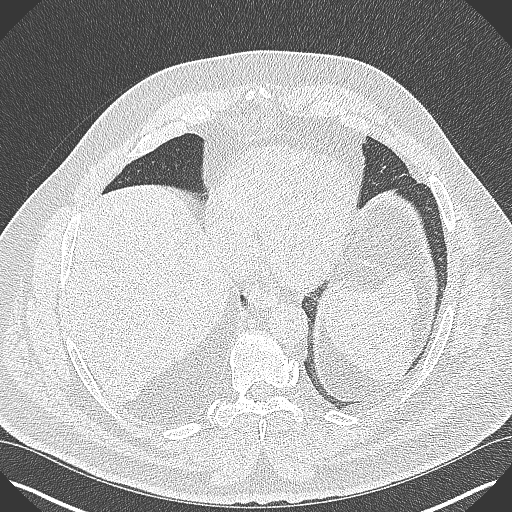
[im 115/299  mediastinal]
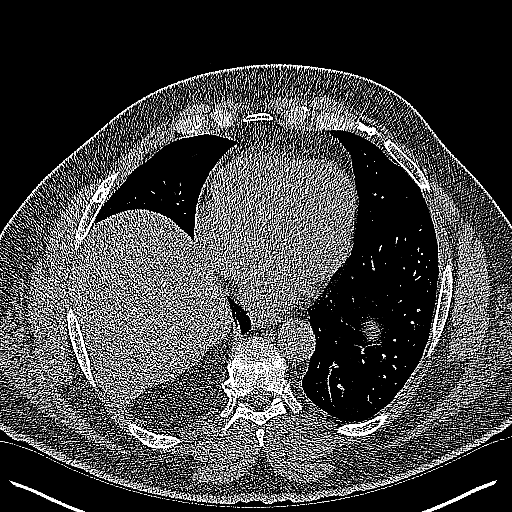
[im 115/299  lung]
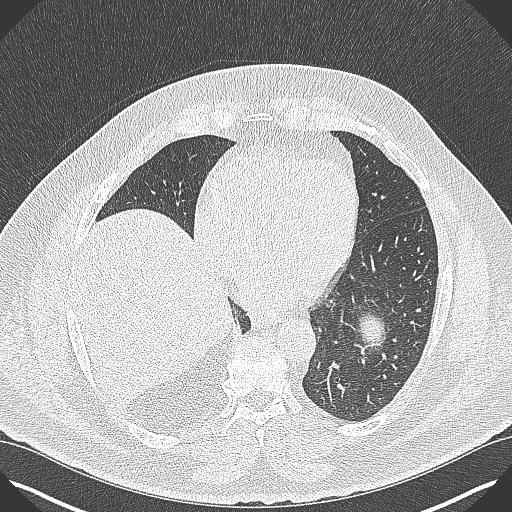
[im 138/299  lung]
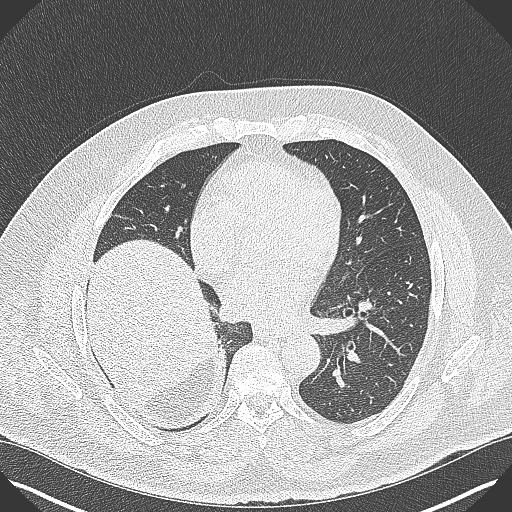
[im 161/299  lung]
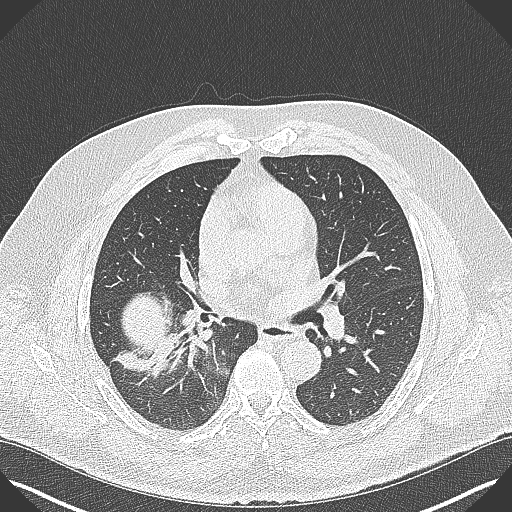
[im 184/299  lung]
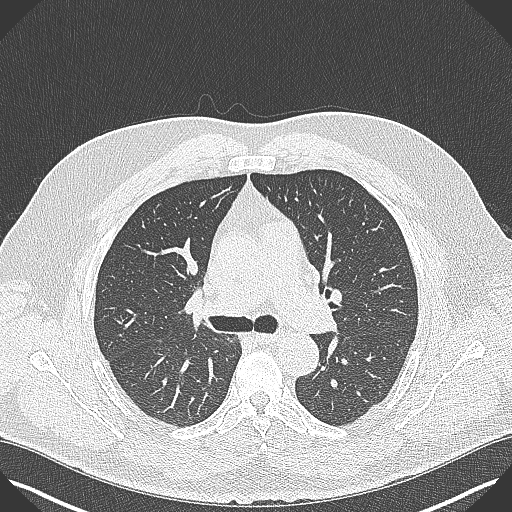
[im 207/299  mediastinal]
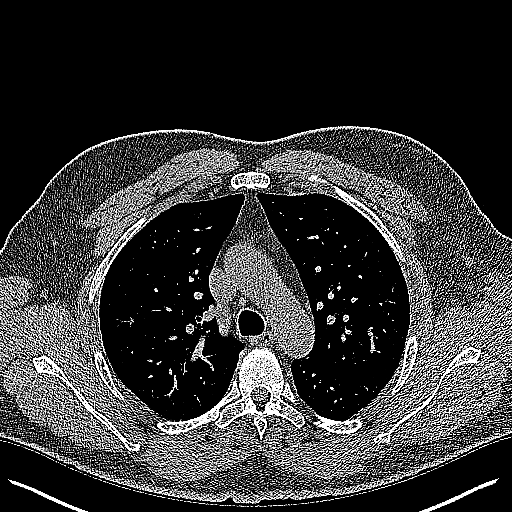
[im 207/299  lung]
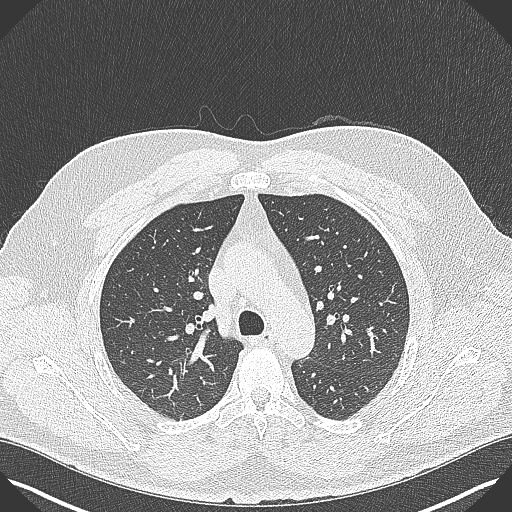
[im 230/299  lung]
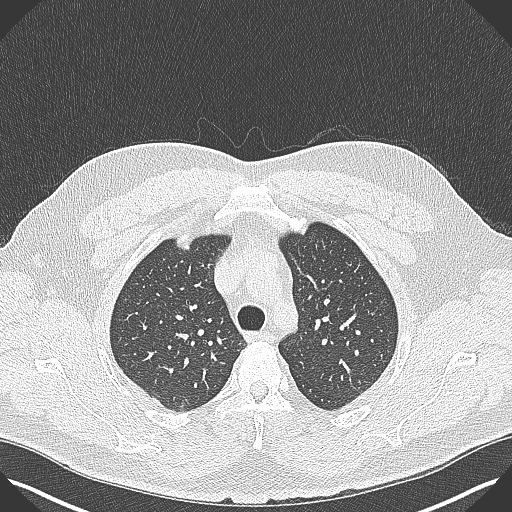
[im 253/299  lung]
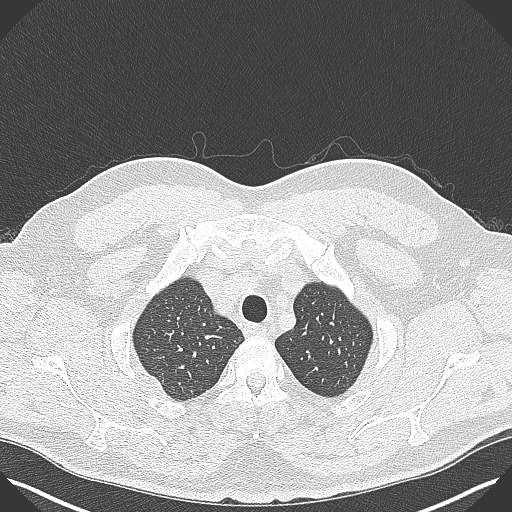
[im 276/299  lung]
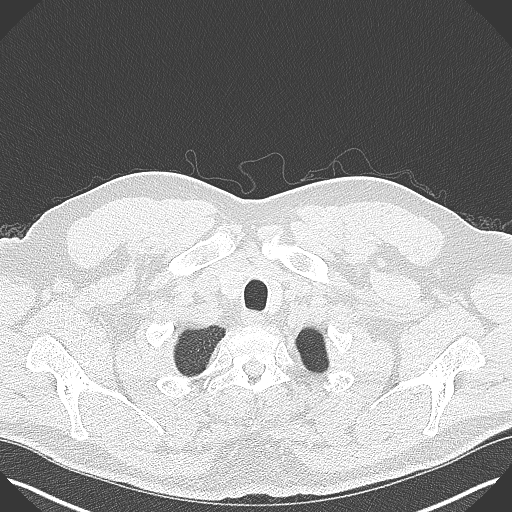

[Series 8: coronal · coronal · 0.61mm/px · 3 of 113 slices shown]
[im 23/113  lung]
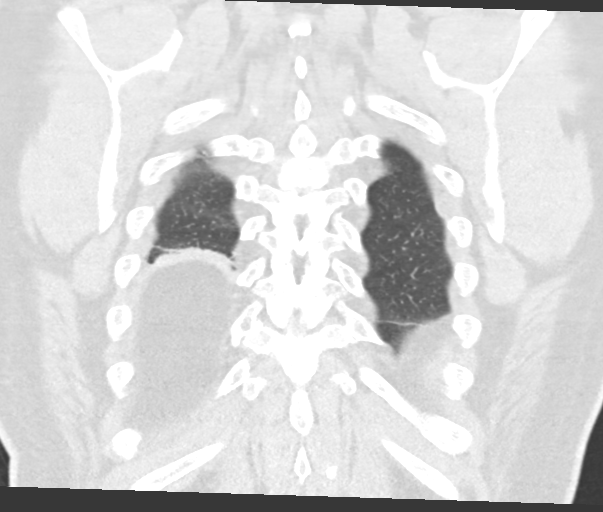
[im 45/113  lung]
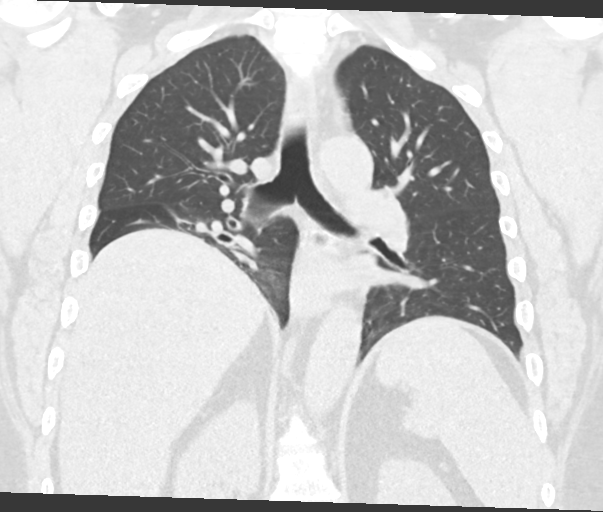
[im 68/113  lung]
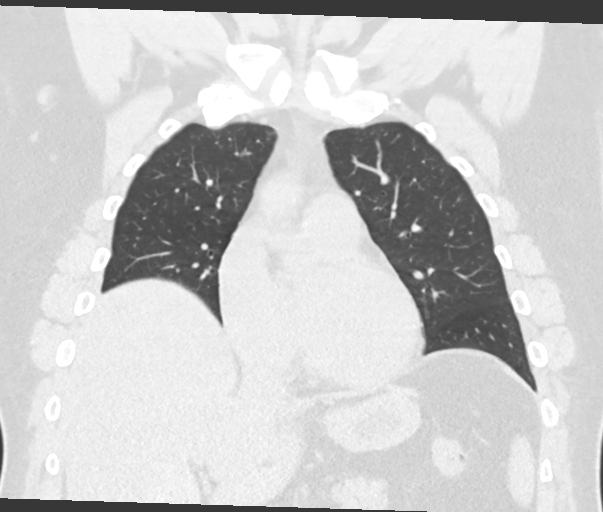

[15 of 36 positions shown; findings below may reference images not displayed]

FINDINGS: Cardiovascular: No significant vascular findings. Normal heart size.
No pericardial effusion.

Mediastinum/Nodes: No enlarged mediastinal, hilar, or axillary lymph
nodes. Thyroid gland, trachea, and esophagus demonstrate no
significant findings.

Lungs/Pleura: Mild, diffuse bilateral bronchial wall thickening.
Elevation of the right hemidiaphragm with associated scarring or
atelectasis. No evidence of fibrotic interstitial lung disease. No
significant air trapping on expiratory phase imaging. No pleural
effusion or pneumothorax.

Upper Abdomen: No acute abnormality.  Hepatic steatosis.

Musculoskeletal: No chest wall abnormality. No suspicious osseous
lesions identified.
IMPRESSION: 1. No evidence of fibrotic interstitial lung disease.
2. Mild, diffuse bilateral bronchial wall thickening, consistent
with nonspecific infectious or inflammatory bronchitis.
3. Elevation of the right hemidiaphragm with associated scarring or
atelectasis.
4. Hepatic steatosis.
